# Patient Record
Sex: Female | Born: 1945 | Race: White | Hispanic: No | Marital: Married | Smoking: Never smoker
Health system: Southern US, Community
[De-identification: ages and names within clinical notes are randomized; demographics above are authoritative.]

## PROBLEM LIST (undated history)

## (undated) DIAGNOSIS — E785 Hyperlipidemia, unspecified: Secondary | ICD-10-CM

## (undated) DIAGNOSIS — E039 Hypothyroidism, unspecified: Secondary | ICD-10-CM

---

## 2016-06-03 DIAGNOSIS — E039 Hypothyroidism, unspecified: Secondary | ICD-10-CM | POA: Insufficient documentation

## 2016-06-05 ENCOUNTER — Other Ambulatory Visit: Payer: Self-pay | Admitting: Internal Medicine

## 2016-06-05 DIAGNOSIS — R921 Mammographic calcification found on diagnostic imaging of breast: Secondary | ICD-10-CM

## 2016-06-24 ENCOUNTER — Other Ambulatory Visit: Payer: Self-pay | Admitting: *Deleted

## 2016-06-24 ENCOUNTER — Inpatient Hospital Stay
Admission: RE | Admit: 2016-06-24 | Discharge: 2016-06-24 | Disposition: A | Discharge status: Home / Self Care | Payer: Self-pay | Source: Ambulatory Visit | Attending: *Deleted | Admitting: *Deleted

## 2016-06-24 DIAGNOSIS — Z9289 Personal history of other medical treatment: Secondary | ICD-10-CM

## 2016-09-05 DIAGNOSIS — E782 Mixed hyperlipidemia: Secondary | ICD-10-CM | POA: Insufficient documentation

## 2016-09-15 ENCOUNTER — Encounter: Payer: Self-pay | Admitting: Radiology

## 2016-09-15 ENCOUNTER — Ambulatory Visit
Admission: RE | Admit: 2016-09-15 | Discharge: 2016-09-15 | Disposition: A | Discharge status: Home / Self Care | Payer: Medicare Other | Source: Ambulatory Visit | Attending: Internal Medicine | Admitting: Internal Medicine

## 2016-09-15 DIAGNOSIS — R921 Mammographic calcification found on diagnostic imaging of breast: Secondary | ICD-10-CM | POA: Diagnosis not present

## 2017-09-22 ENCOUNTER — Other Ambulatory Visit: Payer: Self-pay | Admitting: Internal Medicine

## 2017-09-22 DIAGNOSIS — Z1231 Encounter for screening mammogram for malignant neoplasm of breast: Secondary | ICD-10-CM

## 2017-09-22 DIAGNOSIS — N6489 Other specified disorders of breast: Secondary | ICD-10-CM

## 2017-10-29 ENCOUNTER — Other Ambulatory Visit: Payer: Medicare Other

## 2017-11-09 ENCOUNTER — Ambulatory Visit
Admission: RE | Admit: 2017-11-09 | Discharge: 2017-11-09 | Disposition: A | Discharge status: Home / Self Care | Payer: Medicare Other | Source: Ambulatory Visit | Attending: Internal Medicine | Admitting: Internal Medicine

## 2017-11-09 DIAGNOSIS — Z1231 Encounter for screening mammogram for malignant neoplasm of breast: Secondary | ICD-10-CM | POA: Diagnosis not present

## 2017-11-09 DIAGNOSIS — N6489 Other specified disorders of breast: Secondary | ICD-10-CM | POA: Diagnosis not present

## 2018-03-05 ENCOUNTER — Encounter
Admission: RE | Disposition: A | Discharge status: Home / Self Care | Payer: Self-pay | Source: Ambulatory Visit | Attending: Unknown Physician Specialty

## 2018-03-05 ENCOUNTER — Ambulatory Visit: Payer: Medicare Other | Admitting: Anesthesiology

## 2018-03-05 ENCOUNTER — Ambulatory Visit
Admission: RE | Admit: 2018-03-05 | Discharge: 2018-03-05 | Disposition: A | Discharge status: Home / Self Care | Payer: Medicare Other | Source: Ambulatory Visit | Attending: Unknown Physician Specialty | Admitting: Unknown Physician Specialty

## 2018-03-05 DIAGNOSIS — D123 Benign neoplasm of transverse colon: Secondary | ICD-10-CM | POA: Insufficient documentation

## 2018-03-05 DIAGNOSIS — F17220 Nicotine dependence, chewing tobacco, uncomplicated: Secondary | ICD-10-CM | POA: Diagnosis not present

## 2018-03-05 DIAGNOSIS — Z1211 Encounter for screening for malignant neoplasm of colon: Secondary | ICD-10-CM | POA: Diagnosis present

## 2018-03-05 DIAGNOSIS — Z79890 Hormone replacement therapy (postmenopausal): Secondary | ICD-10-CM | POA: Insufficient documentation

## 2018-03-05 DIAGNOSIS — D122 Benign neoplasm of ascending colon: Secondary | ICD-10-CM | POA: Insufficient documentation

## 2018-03-05 DIAGNOSIS — E039 Hypothyroidism, unspecified: Secondary | ICD-10-CM | POA: Insufficient documentation

## 2018-03-05 DIAGNOSIS — K640 First degree hemorrhoids: Secondary | ICD-10-CM | POA: Insufficient documentation

## 2018-03-05 DIAGNOSIS — K635 Polyp of colon: Secondary | ICD-10-CM | POA: Insufficient documentation

## 2018-03-05 HISTORY — DX: Hyperlipidemia, unspecified: E78.5

## 2018-03-05 HISTORY — DX: Hypothyroidism, unspecified: E03.9

## 2018-03-05 HISTORY — PX: COLONOSCOPY WITH PROPOFOL: SHX5780

## 2018-03-05 SURGERY — COLONOSCOPY WITH PROPOFOL
Anesthesia: General

## 2018-03-05 MED ORDER — SODIUM CHLORIDE 0.9 % IV SOLN: INTRAVENOUS | Status: DC

## 2018-03-05 MED ORDER — PROPOFOL 10 MG/ML IV BOLUS
INTRAVENOUS | Status: DC | PRN
  Administered 2018-03-05: 50 mg via INTRAVENOUS

## 2018-03-05 MED ORDER — SODIUM CHLORIDE 0.9 % IV SOLN
INTRAVENOUS | Status: DC
  Administered 2018-03-05: 1000 mL via INTRAVENOUS

## 2018-03-05 MED ORDER — PROPOFOL 500 MG/50ML IV EMUL
INTRAVENOUS | Status: DC | PRN
  Administered 2018-03-05: 125 ug/kg/min via INTRAVENOUS

## 2018-03-05 NOTE — Anesthesia Postprocedure Evaluation (Signed)
Anesthesia Post Note  Patient: Caitlin Fitzpatrick  Procedure(s) Performed: COLONOSCOPY WITH PROPOFOL (N/A )  Patient location during evaluation: Endoscopy Anesthesia Type: General Level of consciousness: awake and alert Pain management: pain level controlled Vital Signs Assessment: post-procedure vital signs reviewed and stable Respiratory status: spontaneous breathing, nonlabored ventilation, respiratory function stable and patient connected to nasal cannula oxygen Cardiovascular status: blood pressure returned to baseline and stable Postop Assessment: no apparent nausea or vomiting Anesthetic complications: no     Last Vitals:  Vitals:   03/05/18 1325 03/05/18 1335  BP: 108/71 105/73  Pulse: 71 65  Resp: 20 18  Temp:    SpO2: 100% 100%    Last Pain:  Vitals:   03/05/18 1335  TempSrc:   PainSc: 0-No pain                 Precious Haws Yuvonne Lanahan

## 2018-03-05 NOTE — Transfer of Care (Signed)
Immediate Anesthesia Transfer of Care Note  Patient: [employer] SETTLE  Procedure(s) Performed: COLONOSCOPY WITH PROPOFOL (N/A )  Patient Location: PACU and Endoscopy Unit  Anesthesia Type:General  Level of Consciousness: awake, alert  and oriented  Airway & Oxygen Therapy: Patient Spontanous Breathing  Post-op Assessment: Report given to RN and Post -op Vital signs reviewed and stable  Post vital signs: Reviewed and stable  Last Vitals:  Vitals Value Taken Time  BP 148/81 03/05/2018  1:14 PM  Temp    Pulse 85 03/05/2018  1:16 PM  Resp 13 03/05/2018  1:16 PM  SpO2 100 % 03/05/2018  1:16 PM  Vitals shown include unvalidated device data.  Last Pain:  Vitals:   03/05/18 1153  TempSrc: Tympanic  PainSc: 0-No pain         Complications: No apparent anesthesia complications

## 2018-03-05 NOTE — Op Note (Signed)
[facility] Gastroenterology Patient Name: Caitlin Fitzpatrick Procedure Date: 03/05/2018 12:19 PM MRN: [id number] Account #: [id number] Date of Birth: 1946/06/13 Admit Type: Outpatient Age: 72 Room: [facility] ENDO ROOM 2 Gender: Female Note Status: Finalized Procedure:            Colonoscopy Indications:          Screening for colorectal malignant neoplasm Providers:            Manya Silvas, MD Referring MD:         Rusty Aus, MD (Referring MD) Medicines:            Propofol per Anesthesia Complications:        No immediate complications. Procedure:            Pre-Anesthesia Assessment:                       - After reviewing the risks and benefits, the patient                        was deemed in satisfactory condition to undergo the                        procedure.                       After obtaining informed consent, the colonoscope was                        passed under direct vision. Throughout the procedure,                        the patient's blood pressure, pulse, and oxygen                        saturations were monitored continuously. The                        Colonoscope was introduced through the anus and                        advanced to the the cecum, identified by appendiceal                        orifice and ileocecal valve. The colonoscopy was                        performed without difficulty. The patient tolerated the                        procedure well. The quality of the bowel preparation                        was good. Findings:      A diminutive polyp was found in the proximal ascending colon. The polyp       was sessile. The polyp was removed with a jumbo cold forceps. Resection       and retrieval were complete.      Two sessile polyps were found in the transverse colon. The polyps were       diminutive in size. These polyps were removed with a jumbo cold forceps.  Resection and retrieval were complete.      A  diminutive polyp was found in the hepatic flexure. The polyp was       sessile. The polyp was removed with a jumbo cold forceps. Resection and       retrieval were complete.      A diminutive polyp was found in the splenic flexure. The polyp was       sessile. The polyp was removed with a jumbo cold forceps. Resection and       retrieval were complete.      Internal hemorrhoids were found during endoscopy. The hemorrhoids were       small and Grade I (internal hemorrhoids that do not prolapse). Impression:           - One diminutive polyp in the proximal ascending colon,                        removed with a jumbo cold forceps. Resected and                        retrieved.                       - Two diminutive polyps in the transverse colon,                        removed with a jumbo cold forceps. Resected and                        retrieved.                       - One diminutive polyp at the hepatic flexure, removed                        with a jumbo cold forceps. Resected and retrieved.                       - One diminutive polyp at the splenic flexure, removed                        with a jumbo cold forceps. Resected and retrieved.                       - Internal hemorrhoids. Recommendation:       - Await pathology results. Manya Silvas, MD 03/05/2018 1:15:29 PM This report has been signed electronically. Number of Addenda: 0 Note Initiated On: 03/05/2018 12:19 PM Scope Withdrawal Time: 0 hours 15 minutes 2 seconds  Total Procedure Duration: 0 hours 21 minutes 12 seconds       [facility]

## 2018-03-05 NOTE — H&P (Signed)
Primary Care Physician:  Rusty Aus, MD Primary Gastroenterologist:  Dr. Vira Agar  Pre-Procedure History & Physical: HPI:  Caitlin Fitzpatrick is a 72 y.o. female is here for an colonoscopy.  For screening colonoscopy   Past Medical History:  Diagnosis Date  . Hyperlipidemia   . Hypothyroidism     History reviewed. No pertinent surgical history.  Prior to Admission medications   Medication Sig Start Date End Date Taking? Authorizing Provider  Biotin 1 MG CAPS Take 1 capsule by mouth daily.   Yes [provider]  levothyroxine (SYNTHROID, LEVOTHROID) 75 MCG tablet Take 75 mcg by mouth daily before breakfast.   Yes [provider]  Multiple Vitamins-Minerals (EYE VITAMINS & MINERALS) TABS Take by mouth daily.   Yes [provider]    Allergies as of 02/01/2018  . (Not on File)    Family History  Problem Relation Age of Onset  . Breast cancer Mother 61    Social History   Socioeconomic History  . Marital status: Married    Spouse name: Not on file  . Number of children: Not on file  . Years of education: Not on file  . Highest education level: Not on file  Occupational History  . Not on file  Social Needs  . Financial resource strain: Not on file  . Food insecurity:    Worry: Not on file    Inability: Not on file  . Transportation needs:    Medical: Not on file    Non-medical: Not on file  Tobacco Use  . Smoking status: Never Smoker  . Smokeless tobacco: Current User  Substance and Sexual Activity  . Alcohol use: Not on file  . Drug use: Not on file  . Sexual activity: Not on file  Lifestyle  . Physical activity:    Days per week: Not on file    Minutes per session: Not on file  . Stress: Not on file  Relationships  . Social connections:    Talks on phone: Not on file    Gets together: Not on file    Attends religious service: Not on file    Active member of club or organization: Not on file    Attends meetings of clubs or  organizations: Not on file    Relationship status: Not on file  . Intimate partner violence:    Fear of current or ex partner: Not on file    Emotionally abused: Not on file    Physically abused: Not on file    Forced sexual activity: Not on file  Other Topics Concern  . Not on file  Social History Narrative  . Not on file    Review of Systems: See HPI, otherwise negative ROS  Physical Exam: BP 122/70   Pulse 60   Temp 97.8 F (36.6 C) (Tympanic)   Resp 16   Ht 5\' 4"  (1.626 m)   Wt 59.9 kg (132 lb)   SpO2 100%   BMI 22.66 kg/m  General:   Alert,  pleasant and cooperative in NAD Head:  Normocephalic and atraumatic. Neck:  Supple; no masses or thyromegaly. Lungs:  Clear throughout to auscultation.    Heart:  Regular rate and rhythm. Abdomen:  Soft, nontender and nondistended. Normal bowel sounds, without guarding, and without rebound.   Neurologic:  Alert and  oriented x4;  grossly normal neurologically.  Impression/Plan: Caitlin Fitzpatrick is here for an colonoscopy to be performed for screening colon exam.  Risks,  benefits, limitations, and alternatives regarding  colonoscopy have been reviewed with the patient.  Questions have been answered.  All parties agreeable.   Gaylyn Cheers, MD  03/05/2018, 12:44 PM

## 2018-03-05 NOTE — Anesthesia Post-op Follow-up Note (Signed)
Anesthesia QCDR form completed.        

## 2018-03-05 NOTE — Anesthesia Preprocedure Evaluation (Signed)
Anesthesia Evaluation  Patient identified by MRN, date of birth, ID band Patient awake    Reviewed: Allergy & Precautions, H&P , NPO status , Patient's Chart, lab work & pertinent test results, reviewed documented beta blocker date and time   Airway Mallampati: II   Neck ROM: full    Dental  (+) Poor Dentition, Teeth Intact   Pulmonary neg pulmonary ROS,    Pulmonary exam normal        Cardiovascular Exercise Tolerance: Good negative cardio ROS Normal cardiovascular exam Rhythm:regular Rate:Normal     Neuro/Psych negative neurological ROS  negative psych ROS   GI/Hepatic negative GI ROS, Neg liver ROS,   Endo/Other  negative endocrine ROSHypothyroidism   Renal/GU negative Renal ROS  negative genitourinary   Musculoskeletal   Abdominal   Peds  Hematology negative hematology ROS (+)   Anesthesia Other Findings Past Medical History: No date: Hyperlipidemia No date: Hypothyroidism History reviewed. No pertinent surgical history.   Reproductive/Obstetrics negative OB ROS                             Anesthesia Physical Anesthesia Plan  ASA: III  Anesthesia Plan: General   Post-op Pain Management:    Induction:   PONV Risk Score and Plan:   Airway Management Planned:   Additional Equipment:   Intra-op Plan:   Post-operative Plan:   Informed Consent: I have reviewed the patients History and Physical, chart, labs and discussed the procedure including the risks, benefits and alternatives for the proposed anesthesia with the patient or authorized representative who has indicated his/her understanding and acceptance.   Dental Advisory Given  Plan Discussed with: CRNA  Anesthesia Plan Comments:         Anesthesia Quick Evaluation

## 2018-03-08 ENCOUNTER — Encounter: Payer: Self-pay | Admitting: Unknown Physician Specialty

## 2018-03-08 LAB — SURGICAL PATHOLOGY

## 2018-09-07 DIAGNOSIS — D369 Benign neoplasm, unspecified site: Secondary | ICD-10-CM | POA: Insufficient documentation

## 2018-09-07 DIAGNOSIS — Z0000 Encounter for general adult medical examination without abnormal findings: Secondary | ICD-10-CM | POA: Insufficient documentation

## 2018-10-01 ENCOUNTER — Other Ambulatory Visit: Payer: Self-pay | Admitting: Internal Medicine

## 2018-10-01 DIAGNOSIS — Z1231 Encounter for screening mammogram for malignant neoplasm of breast: Secondary | ICD-10-CM

## 2018-11-15 ENCOUNTER — Ambulatory Visit
Admission: RE | Admit: 2018-11-15 | Discharge: 2018-11-15 | Disposition: A | Discharge status: Home / Self Care | Payer: Medicare Other | Source: Ambulatory Visit | Attending: Internal Medicine | Admitting: Internal Medicine

## 2018-11-15 DIAGNOSIS — Z1231 Encounter for screening mammogram for malignant neoplasm of breast: Secondary | ICD-10-CM | POA: Insufficient documentation

## 2019-10-14 ENCOUNTER — Other Ambulatory Visit: Payer: Self-pay | Admitting: Internal Medicine

## 2019-10-14 DIAGNOSIS — Z1231 Encounter for screening mammogram for malignant neoplasm of breast: Secondary | ICD-10-CM

## 2019-11-24 ENCOUNTER — Ambulatory Visit
Admission: RE | Admit: 2019-11-24 | Discharge: 2019-11-24 | Disposition: A | Discharge status: Home / Self Care | Payer: Medicare Other | Source: Ambulatory Visit | Attending: Internal Medicine | Admitting: Internal Medicine

## 2019-11-24 DIAGNOSIS — Z1231 Encounter for screening mammogram for malignant neoplasm of breast: Secondary | ICD-10-CM | POA: Diagnosis present

## 2020-07-11 ENCOUNTER — Other Ambulatory Visit: Payer: Self-pay

## 2020-07-11 ENCOUNTER — Ambulatory Visit: Payer: Medicare Other

## 2020-07-11 ENCOUNTER — Ambulatory Visit (INDEPENDENT_AMBULATORY_CARE_PROVIDER_SITE_OTHER): Payer: Medicare Other | Admitting: Podiatry

## 2020-07-11 ENCOUNTER — Encounter: Payer: Self-pay | Admitting: Podiatry

## 2020-07-11 DIAGNOSIS — Q828 Other specified congenital malformations of skin: Secondary | ICD-10-CM | POA: Diagnosis not present

## 2020-07-11 DIAGNOSIS — M2041 Other hammer toe(s) (acquired), right foot: Secondary | ICD-10-CM

## 2020-07-11 NOTE — Progress Notes (Signed)
  Subjective:  Patient ID: Caitlin Fitzpatrick, female    DOB: [birth year],  MRN: [id number] HPI Chief Complaint  Patient presents with  . Callouses    Patient presents today for painful callous lesion bottom of left 5th sub met x 2-3 months.  She says "it feel like Im walking on a rock"  She has only tried to scrape it off herselt.  No other treatment done    74 y.o. female presents with the above complaint.   ROS: Denies fever chills nausea vomiting muscle aches pains calf pain back pain chest pain shortness of breath.  Past Medical History:  Diagnosis Date  . Hyperlipidemia   . Hypothyroidism    Past Surgical History:  Procedure Laterality Date  . COLONOSCOPY WITH PROPOFOL N/A 03/05/2018   Procedure: COLONOSCOPY WITH PROPOFOL;  Surgeon: Manya Silvas, MD;  Location[facility] ENDOSCOPY;  Service: Endoscopy;  Laterality: N/A;    Current Outpatient Medications:  .  Biotin 1 MG CAPS, Take 1 capsule by mouth daily., Disp: , Rfl:  .  levothyroxine (SYNTHROID, LEVOTHROID) 75 MCG tablet, Take 75 mcg by mouth daily before breakfast., Disp: , Rfl:  .  Multiple Vitamins-Minerals (EYE VITAMINS & MINERALS) TABS, Take by mouth daily., Disp: , Rfl:   No Known Allergies Review of Systems Objective:  There were no vitals filed for this visit.  General: Well developed, nourished, in no acute distress, alert and oriented x3   Dermatological: Skin is warm, dry and supple bilateral. Nails x 10 are well maintained; remaining integument appears unremarkable at this time. There are no open sores, no preulcerative lesions, no rash or signs of infection present.  Porokeratotic lesion subfifth metatarsal head of the left foot.  Vascular: Dorsalis Pedis artery and Posterior Tibial artery pedal pulses are 2/4 bilateral with immedate capillary fill time. Pedal hair growth present. No varicosities and no lower extremity edema present bilateral.   Neruologic: Grossly intact via light touch bilateral. Vibratory  intact via tuning fork bilateral. Protective threshold with Semmes Wienstein monofilament intact to all pedal sites bilateral. Patellar and Achilles deep tendon reflexes 2+ bilateral. No Babinski or clonus noted bilateral.   Musculoskeletal: No gross boney pedal deformities bilateral. No pain, crepitus, or limitation noted with foot and ankle range of motion bilateral. Muscular strength 5/5 in all groups tested bilateral.  Gait: Unassisted, Nonantalgic.    Radiographs:  None taken  Assessment & Plan:   Assessment: Subfifth metatarsal porokeratosis left.  Plan: Debridement of the porokeratotic lesion     Caitlin Fitzpatrick T. [place], [place]

## 2020-10-11 ENCOUNTER — Other Ambulatory Visit: Payer: Self-pay | Admitting: Internal Medicine

## 2020-10-11 DIAGNOSIS — Z1231 Encounter for screening mammogram for malignant neoplasm of breast: Secondary | ICD-10-CM

## 2021-01-03 ENCOUNTER — Other Ambulatory Visit: Payer: Self-pay

## 2021-01-03 ENCOUNTER — Ambulatory Visit
Admission: RE | Admit: 2021-01-03 | Discharge: 2021-01-03 | Disposition: A | Discharge status: Home / Self Care | Payer: Medicare Other | Source: Ambulatory Visit | Attending: Internal Medicine | Admitting: Internal Medicine

## 2021-01-03 DIAGNOSIS — Z1231 Encounter for screening mammogram for malignant neoplasm of breast: Secondary | ICD-10-CM | POA: Insufficient documentation

## 2021-09-30 ENCOUNTER — Other Ambulatory Visit: Payer: Self-pay

## 2021-09-30 ENCOUNTER — Ambulatory Visit (INDEPENDENT_AMBULATORY_CARE_PROVIDER_SITE_OTHER): Payer: Medicare Other | Admitting: Podiatry

## 2021-09-30 DIAGNOSIS — Q828 Other specified congenital malformations of skin: Secondary | ICD-10-CM | POA: Diagnosis not present

## 2021-09-30 NOTE — Progress Notes (Signed)
She presents today chief complaint of a painful area to the plantar aspect of her right hallux as well as the plantar aspect of her left foot.  States that these been here for quite some time this will come up in the summer she refers to the right hallux.  Objective: Vital signs are stable alert oriented x3 there is no erythema edema cellulitis drainage odor she has benign skin lesions to the plantar aspect of the hallux interphalangeal joint of the right foot does not demonstrate any type of verrucoid lesion plantar aspect of the left foot similar findings no verrucoid lesion.  Assessment: Is benign skin lesions x2 bilateral.  Plan: Debridement of these reactive hyperkeratotic lesions without iatrogenic lesions.

## 2021-11-20 ENCOUNTER — Other Ambulatory Visit: Payer: Self-pay | Admitting: Internal Medicine

## 2021-11-20 DIAGNOSIS — Z1231 Encounter for screening mammogram for malignant neoplasm of breast: Secondary | ICD-10-CM

## 2022-01-06 ENCOUNTER — Ambulatory Visit
Admission: RE | Admit: 2022-01-06 | Discharge: 2022-01-06 | Disposition: A | Discharge status: Home / Self Care | Payer: Medicare Other | Source: Ambulatory Visit | Attending: Internal Medicine | Admitting: Internal Medicine

## 2022-01-06 ENCOUNTER — Other Ambulatory Visit: Payer: Self-pay

## 2022-01-06 DIAGNOSIS — Z1231 Encounter for screening mammogram for malignant neoplasm of breast: Secondary | ICD-10-CM | POA: Diagnosis not present

## 2022-03-18 ENCOUNTER — Encounter: Payer: Self-pay | Admitting: Internal Medicine

## 2022-03-19 ENCOUNTER — Ambulatory Visit
Admission: RE | Admit: 2022-03-19 | Discharge: 2022-03-19 | Disposition: A | Discharge status: Home / Self Care | Payer: Medicare Other | Attending: Internal Medicine | Admitting: Internal Medicine

## 2022-03-19 ENCOUNTER — Ambulatory Visit: Payer: Medicare Other | Admitting: Registered Nurse

## 2022-03-19 ENCOUNTER — Encounter: Payer: Self-pay | Admitting: Internal Medicine

## 2022-03-19 ENCOUNTER — Encounter
Admission: RE | Disposition: A | Discharge status: Home / Self Care | Payer: Self-pay | Source: Home / Self Care | Attending: Internal Medicine

## 2022-03-19 DIAGNOSIS — Z09 Encounter for follow-up examination after completed treatment for conditions other than malignant neoplasm: Secondary | ICD-10-CM | POA: Diagnosis present

## 2022-03-19 DIAGNOSIS — Z86010 Personal history of colonic polyps: Secondary | ICD-10-CM | POA: Insufficient documentation

## 2022-03-19 DIAGNOSIS — D128 Benign neoplasm of rectum: Secondary | ICD-10-CM | POA: Insufficient documentation

## 2022-03-19 DIAGNOSIS — K640 First degree hemorrhoids: Secondary | ICD-10-CM | POA: Diagnosis not present

## 2022-03-19 DIAGNOSIS — E039 Hypothyroidism, unspecified: Secondary | ICD-10-CM | POA: Diagnosis not present

## 2022-03-19 HISTORY — PX: COLONOSCOPY WITH PROPOFOL: SHX5780

## 2022-03-19 SURGERY — COLONOSCOPY WITH PROPOFOL
Anesthesia: General

## 2022-03-19 MED ORDER — PROPOFOL 500 MG/50ML IV EMUL
INTRAVENOUS | Status: DC | PRN
  Administered 2022-03-19: 125 ug/kg/min via INTRAVENOUS

## 2022-03-19 MED ORDER — PROPOFOL 500 MG/50ML IV EMUL
INTRAVENOUS | Status: AC
  Filled 2022-03-19: qty 50

## 2022-03-19 MED ORDER — LIDOCAINE HCL (CARDIAC) PF 100 MG/5ML IV SOSY
PREFILLED_SYRINGE | INTRAVENOUS | Status: DC | PRN
Start: 2022-03-19 — End: 2022-03-19
  Administered 2022-03-19: 60 mg via INTRAVENOUS

## 2022-03-19 MED ORDER — SODIUM CHLORIDE 0.9 % IV SOLN
INTRAVENOUS | Status: DC
  Administered 2022-03-19: 1000 mL via INTRAVENOUS

## 2022-03-19 MED ORDER — PROPOFOL 10 MG/ML IV BOLUS
INTRAVENOUS | Status: DC | PRN
  Administered 2022-03-19: 50 mg via INTRAVENOUS

## 2022-03-19 NOTE — Interval H&P Note (Signed)
History and Physical Interval Note: ? ?03/19/2022 ?2:53 PM ? ?Caitlin Fitzpatrick  has presented today for surgery, with the diagnosis of Z86.010  - Hx of adenomatous colonic polyps ?K59.09  - Chronic constipation.  The various methods of treatment have been discussed with the patient and family. After consideration of risks, benefits and other options for treatment, the patient has consented to  Procedure(s): ?COLONOSCOPY WITH PROPOFOL (N/A) as a surgical intervention.  The patient's history has been reviewed, patient examined, no change in status, stable for surgery.  I have reviewed the patient's chart and labs.  Questions were answered to the patient's satisfaction.   ? ? ?[place], [place] ? ? ?

## 2022-03-19 NOTE — Anesthesia Preprocedure Evaluation (Signed)
Anesthesia Evaluation  ?Patient identified by MRN, date of birth, ID band ?Patient awake ? ? ? ?Reviewed: ?Allergy & Precautions, NPO status , Patient's Chart, lab work & pertinent test results ? ?History of Anesthesia Complications ?Negative for: history of anesthetic complications ? ?Airway ?Mallampati: II ? ?TM Distance: >3 FB ?Neck ROM: Full ? ? ? Dental ?no notable dental hx. ?(+) Teeth Intact ?  ?Pulmonary ?neg pulmonary ROS, neg sleep apnea, neg COPD, Patient abstained from smoking.Not current smoker,  ?  ?Pulmonary exam normal ?breath sounds clear to auscultation ? ? ? ? ? ? Cardiovascular ?Exercise Tolerance: Good ?METS(-) hypertension(-) CAD and (-) Past MI negative cardio ROS ? ?(-) dysrhythmias  ?Rhythm:Regular Rate:Normal ?- Systolic murmurs ? ?  ?Neuro/Psych ?negative neurological ROS ? negative psych ROS  ? GI/Hepatic ?neg GERD  ,(+)  ?  ? (-) substance abuse ? ,   ?Endo/Other  ?neg diabetesHypothyroidism  ? Renal/GU ?negative Renal ROS  ? ?  ?Musculoskeletal ? ? Abdominal ?  ?Peds ? Hematology ?  ?Anesthesia Other Findings ?Past Medical History: ?No date: Hyperlipidemia ?No date: Hypothyroidism ? Reproductive/Obstetrics ? ?  ? ? ? ? ? ? ? ? ? ? ? ? ? ?  ?  ? ? ? ? ? ? ? ? ?Anesthesia Physical ?Anesthesia Plan ? ?ASA: 2 ? ?Anesthesia Plan: General  ? ?Post-op Pain Management: Minimal or no pain anticipated  ? ?Induction: Intravenous ? ?PONV Risk Score and Plan: 3 and Propofol infusion, TIVA and Ondansetron ? ?Airway Management Planned: Nasal Cannula ? ?Additional Equipment: None ? ?Intra-op Plan:  ? ?Post-operative Plan:  ? ?Informed Consent: I have reviewed the patients History and Physical, chart, labs and discussed the procedure including the risks, benefits and alternatives for the proposed anesthesia with the patient or authorized representative who has indicated his/her understanding and acceptance.  ? ? ? ?Dental advisory given ? ?Plan Discussed with: CRNA and  Surgeon ? ?Anesthesia Plan Comments: (Discussed risks of anesthesia with patient, including possibility of difficulty with spontaneous ventilation under anesthesia necessitating airway intervention, PONV, and rare risks such as cardiac or respiratory or neurological events, and allergic reactions. Discussed the role of CRNA in patient's perioperative care. Patient understands.)  ? ? ? ? ? ? ?Anesthesia Quick Evaluation ? ?

## 2022-03-19 NOTE — H&P (Signed)
Outpatient short stay form Pre-procedure ?03/19/2022 2:52 PM ?Caitlin Fitzpatrick, M.D. ? ?Primary Physician: Emily Filbert, M.D. ? ?Reason for visit:  Personal history of adenomatous colon polyps ? ?History of present illness:  Caitlin Fitzpatrick is a 76 y.o. female with history of adenomatous colon polyp returns for repeat surveillance colonoscopy. She denies any change in bowel habits but does have mild chronic constipation. She takes a stool softener and fiber and has a bowel movement once every few days. No anorexia, abdominal pain, change in bowel habits, or hematochezia. She denies any upper GI complaints.   ? ?COLONOSCOPY 03/05/2018  ?Adenomatous Polyps: CBF 02/2021  ? COLONOSCOPY ~2008, 1999  ?No polyps per pt.   ? ? ? ?Current Facility-Administered Medications:  ?  0.9 %  sodium chloride infusion, , Intravenous, Continuous, [place], Benay Pike, MD, Last Rate: 20 mL/hr at 03/19/22 1414, 1,000 mL at 03/19/22 1414 ? ?Medications Prior to Admission  ?Medication Sig Dispense Refill Last Dose  ? Biotin 1 MG CAPS Take 1 capsule by mouth daily.   Past Week  ? levothyroxine (SYNTHROID, LEVOTHROID) 75 MCG tablet Take 75 mcg by mouth daily before breakfast.   03/19/2022  ? Multiple Vitamins-Minerals (EYE VITAMINS & MINERALS) TABS Take by mouth daily.   Past Week  ? meloxicam (MOBIC) 15 MG tablet Take 15 mg by mouth daily. (Patient not taking: Reported on 03/19/2022)   Not Taking  ? ? ? ?No Known Allergies ? ? ?Past Medical History:  ?Diagnosis Date  ? Hyperlipidemia   ? Hypothyroidism   ? ? ?Review of systems:  Otherwise negative.  ? ? ?Physical Exam ? ?Gen: Alert, oriented. Appears stated age.  ?HEENT: [place]/AT. PERRLA. ?Lungs: CTA, no wheezes. ?CV: RR nl S1, S2. ?Abd: soft, benign, no masses. BS+ ?Ext: No edema. Pulses 2+ ? ? ? ?Planned procedures: Proceed with colonoscopy. The patient understands the nature of the planned procedure, indications, risks, alternatives and potential complications including but not limited to bleeding,  infection, perforation, damage to internal organs and possible oversedation/side effects from anesthesia. The patient agrees and gives consent to proceed.  ?Please refer to procedure notes for findings, recommendations and patient disposition/instructions.  ? ? ? ?Saydie Gerdts K. Alice Fitzpatrick, M.D. ?Gastroenterology ?03/19/2022  2:52 PM ? ? ? ? ? ? ?

## 2022-03-19 NOTE — Op Note (Signed)
[facility] ?Gastroenterology ?Patient Name: Caitlin Fitzpatrick ?Procedure Date: 03/19/2022 2:49 PM ?MRN: [id number] ?Account #: [id number] ?Date of Birth: [birth year] ?Admit Type: Outpatient ?Age: 76 ?Room: [facility] ENDO ROOM 2 ?Gender: Female ?Note Status: Finalized ?Instrument Name: Colonoscope [id number] ?Procedure:             Colonoscopy ?Indications:           High risk colon cancer surveillance: Personal history  ?                       of non-advanced adenoma ?Providers:             Benay Pike. Alice Reichert MD, MD ?Referring MD:          Rusty Aus, MD (Referring MD) ?Medicines:             Propofol per Anesthesia ?Complications:         No immediate complications. ?Procedure:             Pre-Anesthesia Assessment: ?                       - The risks and benefits of the procedure and the  ?                       sedation options and risks were discussed with the  ?                       patient. All questions were answered and informed  ?                       consent was obtained. ?                       - Patient identification and proposed procedure were  ?                       verified prior to the procedure by the nurse. The  ?                       procedure was verified in the procedure room. ?                       - ASA Grade Assessment: III - A patient with severe  ?                       systemic disease. ?                       - After reviewing the risks and benefits, the patient  ?                       was deemed in satisfactory condition to undergo the  ?                       procedure. ?                       After obtaining informed consent, the colonoscope was  ?                       passed under  direct vision. Throughout the procedure,  ?                       the patient's blood pressure, pulse, and oxygen  ?                       saturations were monitored continuously. The  ?                       Colonoscope was introduced through the anus and  ?                       advanced  to the the cecum, identified by appendiceal  ?                       orifice and ileocecal valve. The colonoscopy was  ?                       performed without difficulty. The patient tolerated  ?                       the procedure well. The quality of the bowel  ?                       preparation was good. The ileocecal valve, appendiceal  ?                       orifice, and rectum were photographed. ?Findings: ?     The perianal and digital rectal examinations were normal. Pertinent  ?     negatives include normal sphincter tone and no palpable rectal lesions. ?     Non-bleeding internal hemorrhoids were found during retroflexion. The  ?     hemorrhoids were Grade I (internal hemorrhoids that do not prolapse). ?     A 4 mm polyp was found in the rectum. The polyp was sessile. The polyp  ?     was removed with a jumbo cold forceps. Resection and retrieval were  ?     complete. ?     The exam was otherwise without abnormality. ?Impression:            - Non-bleeding internal hemorrhoids. ?                       - One 4 mm polyp in the rectum, removed with a jumbo  ?                       cold forceps. Resected and retrieved. ?                       - The examination was otherwise normal. ?Recommendation:        - Patient has a contact number available for  ?                       emergencies. The signs and symptoms of potential  ?                       delayed complications were discussed with the patient.  ?  Return to normal activities tomorrow. Written  ?                       discharge instructions were provided to the patient. ?                       - Resume previous diet. ?                       - Continue present medications. ?                       - If polyps are benign or adenomatous without  ?                       dysplasia, I will advise NO further colonoscopy due to  ?                       advanced age and/or severe comorbidity. ?                       - Return to GI office  PRN. ?                       - The findings and recommendations were discussed with  ?                       the patient. ?Procedure Code(s):     --- Professional --- ?                       [phone], Colonoscopy, flexible; with biopsy, single or  ?                       multiple ?Diagnosis Code(s):     --- Professional --- ?                       Z86.010, Personal history of colonic polyps ?                       K62.1, Rectal polyp ?                       K64.0, First degree hemorrhoids ?CPT copyright 2019 [employer]. All rights reserved. ?The codes documented in this report are preliminary and upon coder review may  ?be revised to meet current compliance requirements. ?Efrain Sella MD, MD ?03/19/2022 3:24:14 PM ?This report has been signed electronically. ?Number of Addenda: 0 ?Note Initiated On: 03/19/2022 2:49 PM ?Scope Withdrawal Time: 0 hours 4 minutes 58 seconds  ?Total Procedure Duration: 0 hours 8 minutes 18 seconds  ?Estimated Blood Loss:  Estimated blood loss: none. ?     [facility] ?

## 2022-03-19 NOTE — Transfer of Care (Signed)
Immediate Anesthesia Transfer of Care Note ? ?Patient: Caitlin Fitzpatrick ? ?Procedure(s) Performed: COLONOSCOPY WITH PROPOFOL ? ?Patient Location: PACU ? ?Anesthesia Type:General ? ?Level of Consciousness: drowsy ? ?Airway & Oxygen Therapy: Patient Spontanous Breathing ? ?Post-op Assessment: Report given to RN and Post -op Vital signs reviewed and stable ? ?Post vital signs: Reviewed and stable ? ?Last Vitals:  ?Vitals Value Taken Time  ?BP    ?Temp    ?Pulse    ?Resp    ?SpO2    ? ? ?Last Pain:  ?Vitals:  ? 03/19/22 1356  ?TempSrc: Temporal  ?PainSc: 0-No pain  ?   ? ?  ? ?Complications: No notable events documented. ?

## 2022-03-20 NOTE — Anesthesia Postprocedure Evaluation (Signed)
Anesthesia Post Note ? ?Patient: Caitlin Fitzpatrick ? ?Procedure(s) Performed: COLONOSCOPY WITH PROPOFOL ? ?Patient location during evaluation: Endoscopy ?Anesthesia Type: General ?Level of consciousness: awake and alert ?Pain management: pain level controlled ?Vital Signs Assessment: post-procedure vital signs reviewed and stable ?Respiratory status: spontaneous breathing, nonlabored ventilation, respiratory function stable and patient connected to nasal cannula oxygen ?Cardiovascular status: blood pressure returned to baseline and stable ?Postop Assessment: no apparent nausea or vomiting ?Anesthetic complications: no ? ? ?No notable events documented. ? ? ?Last Vitals:  ?Vitals:  ? 03/19/22 1356 03/19/22 1525  ?BP: 128/73   ?Pulse: 79   ?Resp: 18   ?Temp: (!) 35.9 ?C 36.8 ?C  ?  ?Last Pain:  ?Vitals:  ? 03/19/22 1535  ?TempSrc:   ?PainSc: 0-No pain  ? ? ?  ?  ?  ?  ?  ?  ? ?Arita Miss ? ? ? ? ?

## 2022-03-21 ENCOUNTER — Encounter: Payer: Self-pay | Admitting: Internal Medicine

## 2022-03-21 LAB — SURGICAL PATHOLOGY

## 2022-11-24 ENCOUNTER — Other Ambulatory Visit: Payer: Self-pay | Admitting: Internal Medicine

## 2022-11-24 DIAGNOSIS — Z1231 Encounter for screening mammogram for malignant neoplasm of breast: Secondary | ICD-10-CM

## 2023-01-07 ENCOUNTER — Ambulatory Visit
Admission: RE | Admit: 2023-01-07 | Discharge: 2023-01-07 | Disposition: A | Discharge status: Home / Self Care | Payer: Medicare Other | Source: Ambulatory Visit | Attending: Internal Medicine | Admitting: Internal Medicine

## 2023-01-07 DIAGNOSIS — Z1231 Encounter for screening mammogram for malignant neoplasm of breast: Secondary | ICD-10-CM | POA: Diagnosis present

## 2023-09-08 IMAGING — MG MM DIGITAL SCREENING BILAT W/ TOMO AND CAD
6 of 10 series · 6 of 30 positions shown · non-contrast
Comparison: Previous exam(s).

CLINICAL DATA: Screening.

EXAM:
DIGITAL SCREENING BILATERAL MAMMOGRAM WITH TOMOSYNTHESIS AND CAD
TECHNIQUE: Bilateral screening digital craniocaudal and mediolateral oblique
mammograms were obtained. Bilateral screening digital breast
tomosynthesis was performed. The images were evaluated with
computer-aided detection.

[R CC synth-2D]
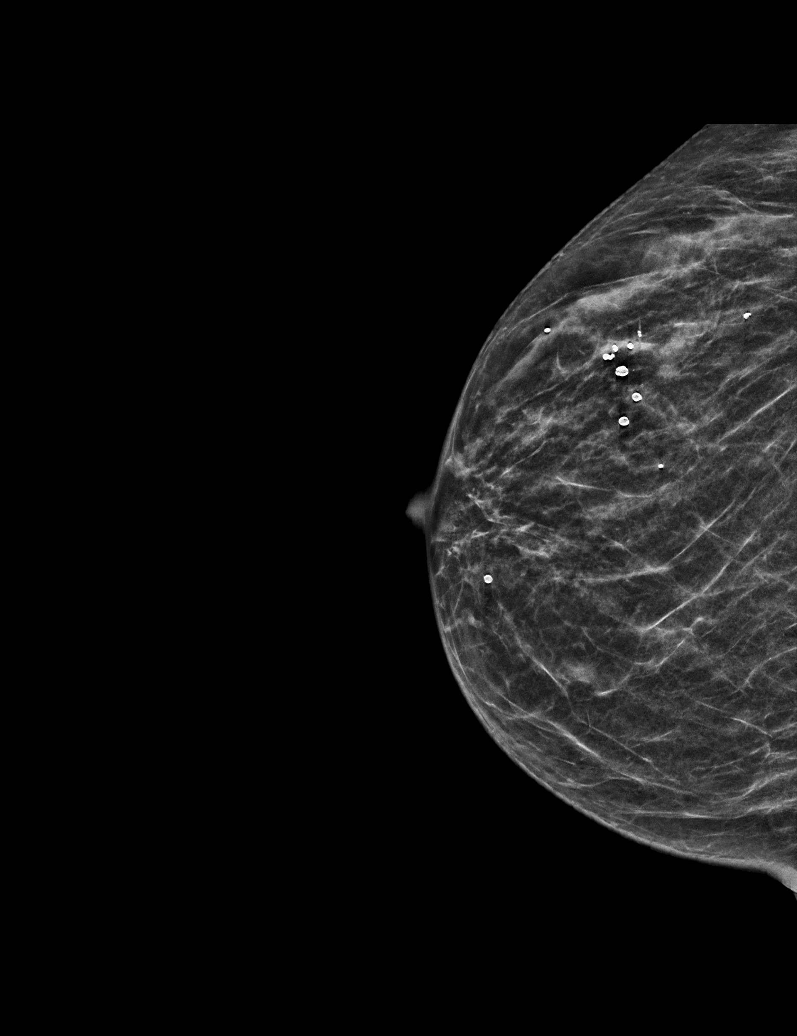

[R MLO synth-2D (1 of 2)]
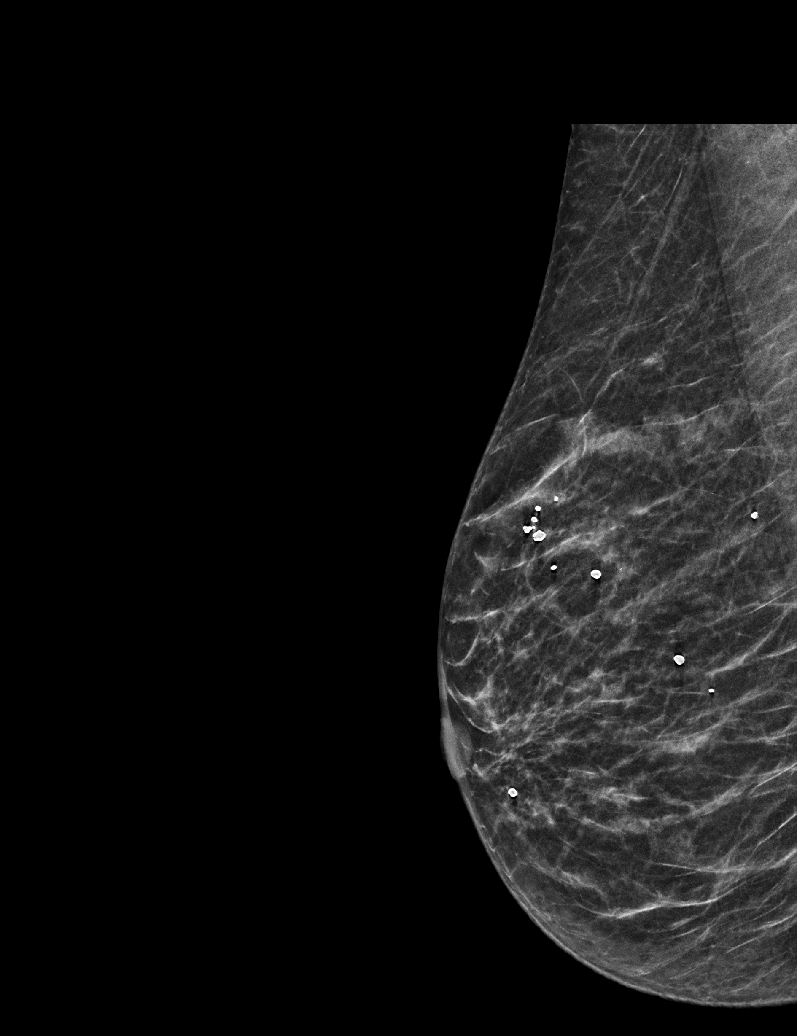

[R MLO synth-2D (2 of 2)]
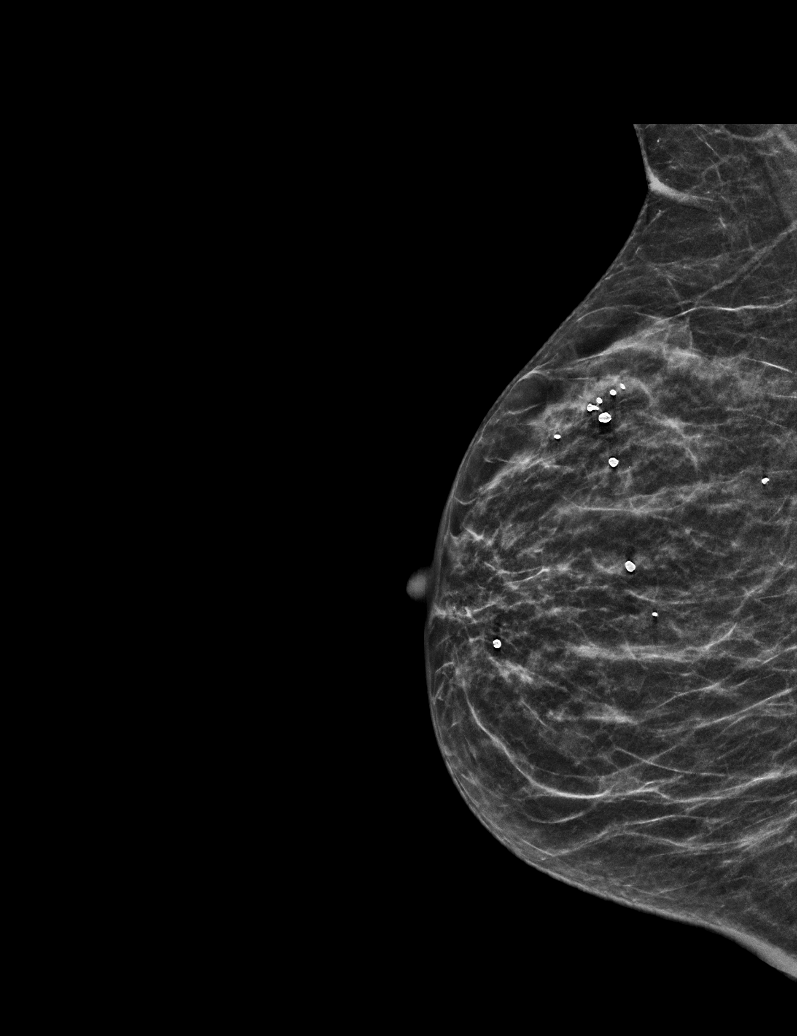

[L CC synth-2D]
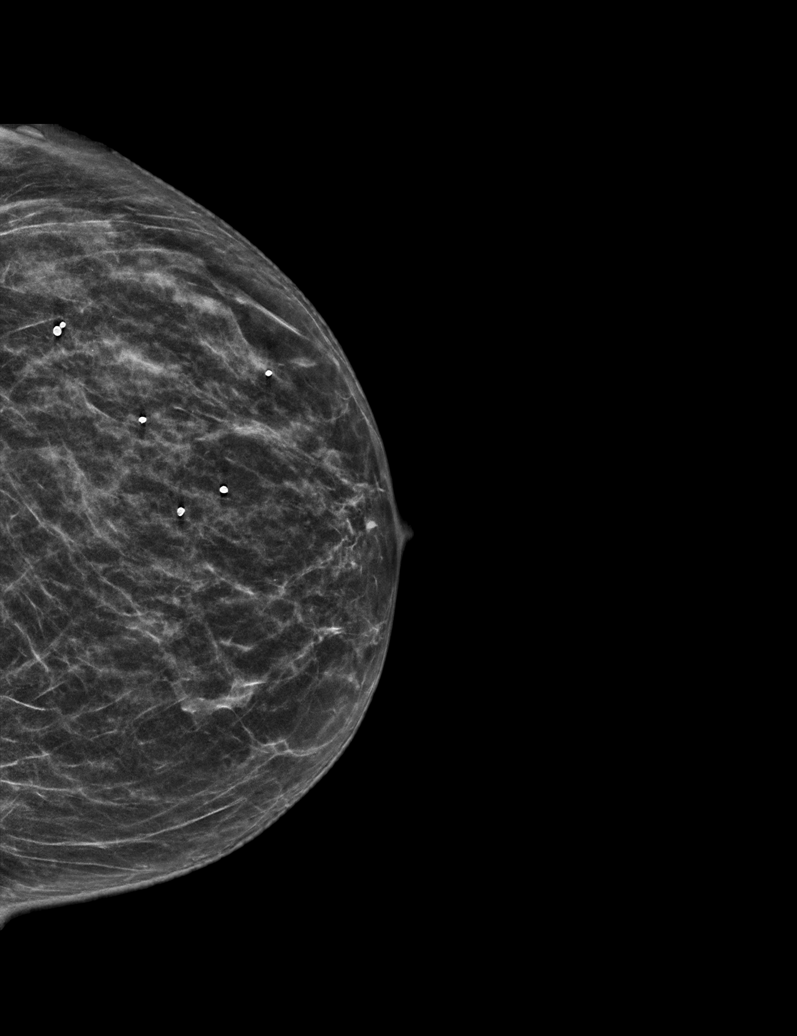

[L MLO synth-2D]
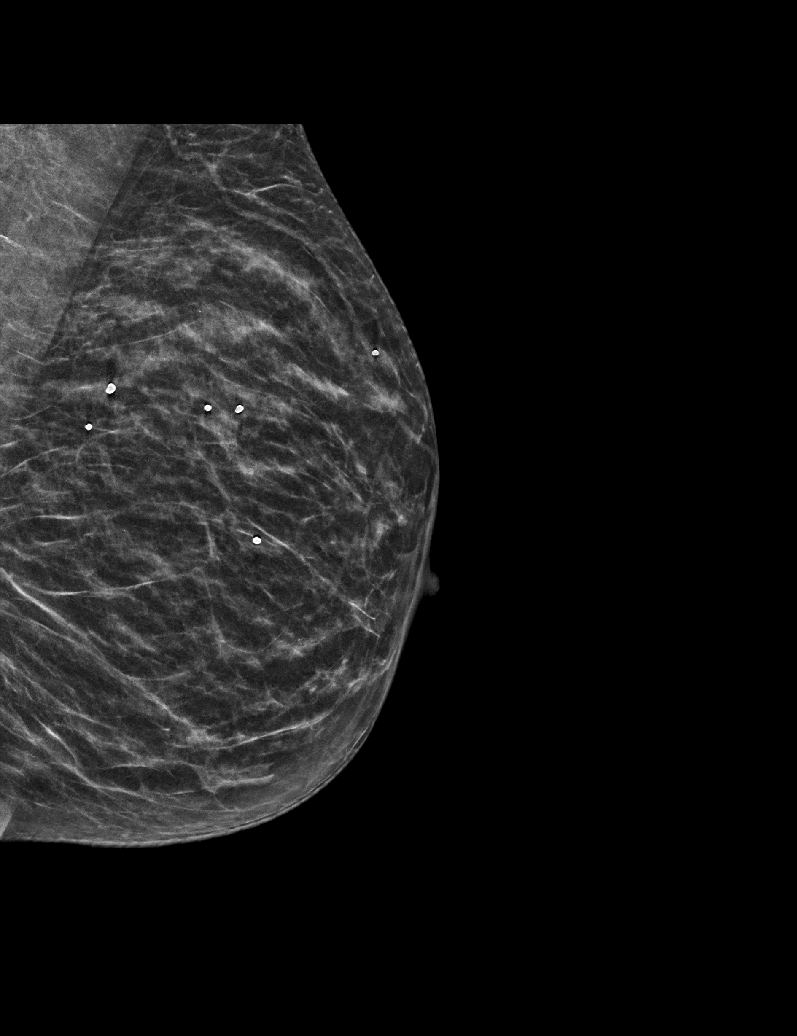

[L MLO tomo · tomo slice 23/45.0]
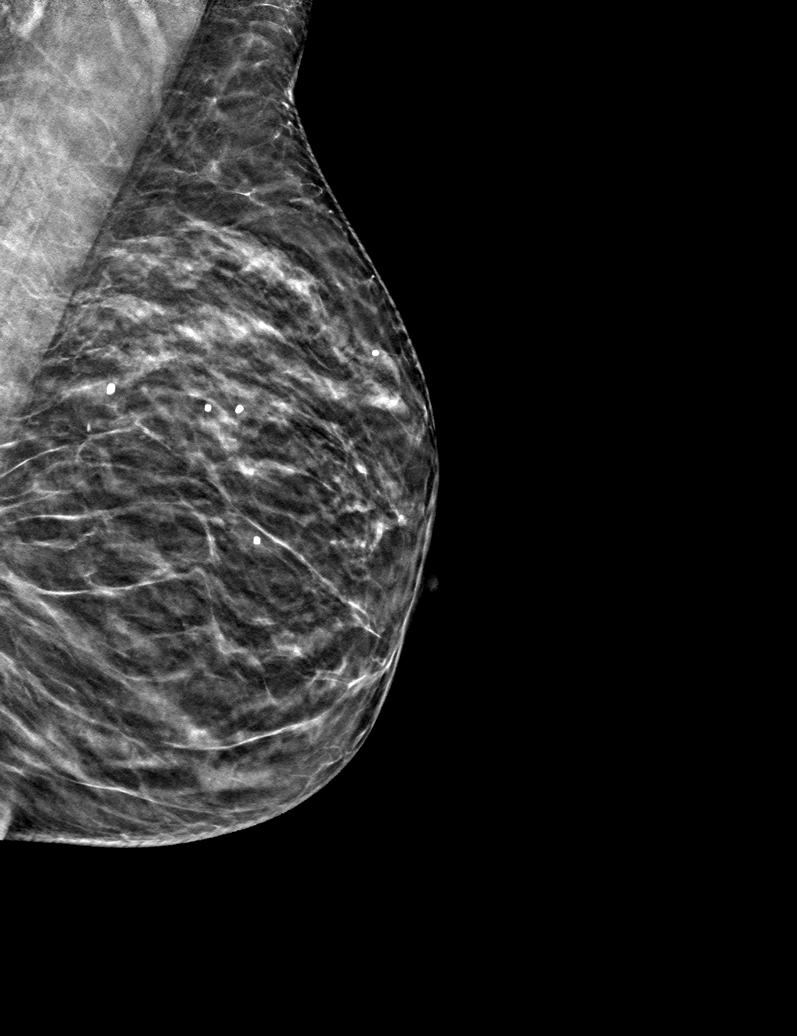

[6 of 30 positions shown; findings below may reference images not displayed]

ACR Breast Density Category b: There are scattered areas of
fibroglandular density.
FINDINGS: There are no findings suspicious for malignancy.
IMPRESSION: No mammographic evidence of malignancy. A result letter of this
screening mammogram will be mailed directly to the patient.

RECOMMENDATION:
Screening mammogram in one year. (Code:51-O-LD2)

BI-RADS CATEGORY  1: Negative.

## 2024-02-08 ENCOUNTER — Other Ambulatory Visit: Payer: Self-pay | Admitting: Internal Medicine

## 2024-02-08 DIAGNOSIS — Z1231 Encounter for screening mammogram for malignant neoplasm of breast: Secondary | ICD-10-CM

## 2024-02-16 ENCOUNTER — Ambulatory Visit
Admission: RE | Admit: 2024-02-16 | Discharge: 2024-02-16 | Disposition: A | Discharge status: Home / Self Care | Source: Ambulatory Visit | Attending: Internal Medicine | Admitting: Internal Medicine

## 2024-02-16 DIAGNOSIS — Z1231 Encounter for screening mammogram for malignant neoplasm of breast: Secondary | ICD-10-CM | POA: Insufficient documentation

## 2024-08-01 ENCOUNTER — Ambulatory Visit (INDEPENDENT_AMBULATORY_CARE_PROVIDER_SITE_OTHER): Admitting: Podiatry

## 2024-08-01 DIAGNOSIS — L989 Disorder of the skin and subcutaneous tissue, unspecified: Secondary | ICD-10-CM

## 2024-08-01 DIAGNOSIS — M7752 Other enthesopathy of left foot: Secondary | ICD-10-CM

## 2024-08-01 NOTE — Progress Notes (Signed)
 She presents today chief complaint of a painful corn to the plantar aspect of her left foot.  She states it is right under here she points to the fifth metatarsal of the left foot.  States that she is not traveling anymore because her husband has worsening neuropathy and be will be wheelchair-bound soon.  Objective: Vital signs are stable alert oriented x 3 pulses are palpable.  She has a palpable fluctuance beneath the fifth metatarsal head of the left foot with an overlying benign hyperkeratotic lesion.  This is painful on palpation as well as debridement. Assessment: Bursitis subfifth met head left foot.  With overlying benign skin lesion.  Plan: Injected dexamethasone local anesthetic beneath the lesion into the bursa.  And also debrided the lesion.
